# Patient Record
Sex: Male | Born: 1970 | Race: White | Hispanic: No | Smoking: Never smoker
Health system: Southern US, Community
[De-identification: ages and names within clinical notes are randomized; demographics above are authoritative.]

## PROBLEM LIST (undated history)

## (undated) DIAGNOSIS — E785 Hyperlipidemia, unspecified: Secondary | ICD-10-CM

## (undated) HISTORY — DX: Hyperlipidemia, unspecified: E78.5

---

## 2006-07-27 ENCOUNTER — Encounter: Admission: RE | Admit: 2006-07-27 | Discharge: 2006-07-27 | Payer: Self-pay | Admitting: Internal Medicine

## 2008-10-06 IMAGING — CR DG HAND COMPLETE 3+V*R*
3 series · 3 of 3 positions shown · non-contrast
Comparison: none

CLINICAL DATA: Crush injury. Pain in thumb and carpal region.
 RIGHT HAND ? 3 VIEW:

[view not recorded (1 of 3)]
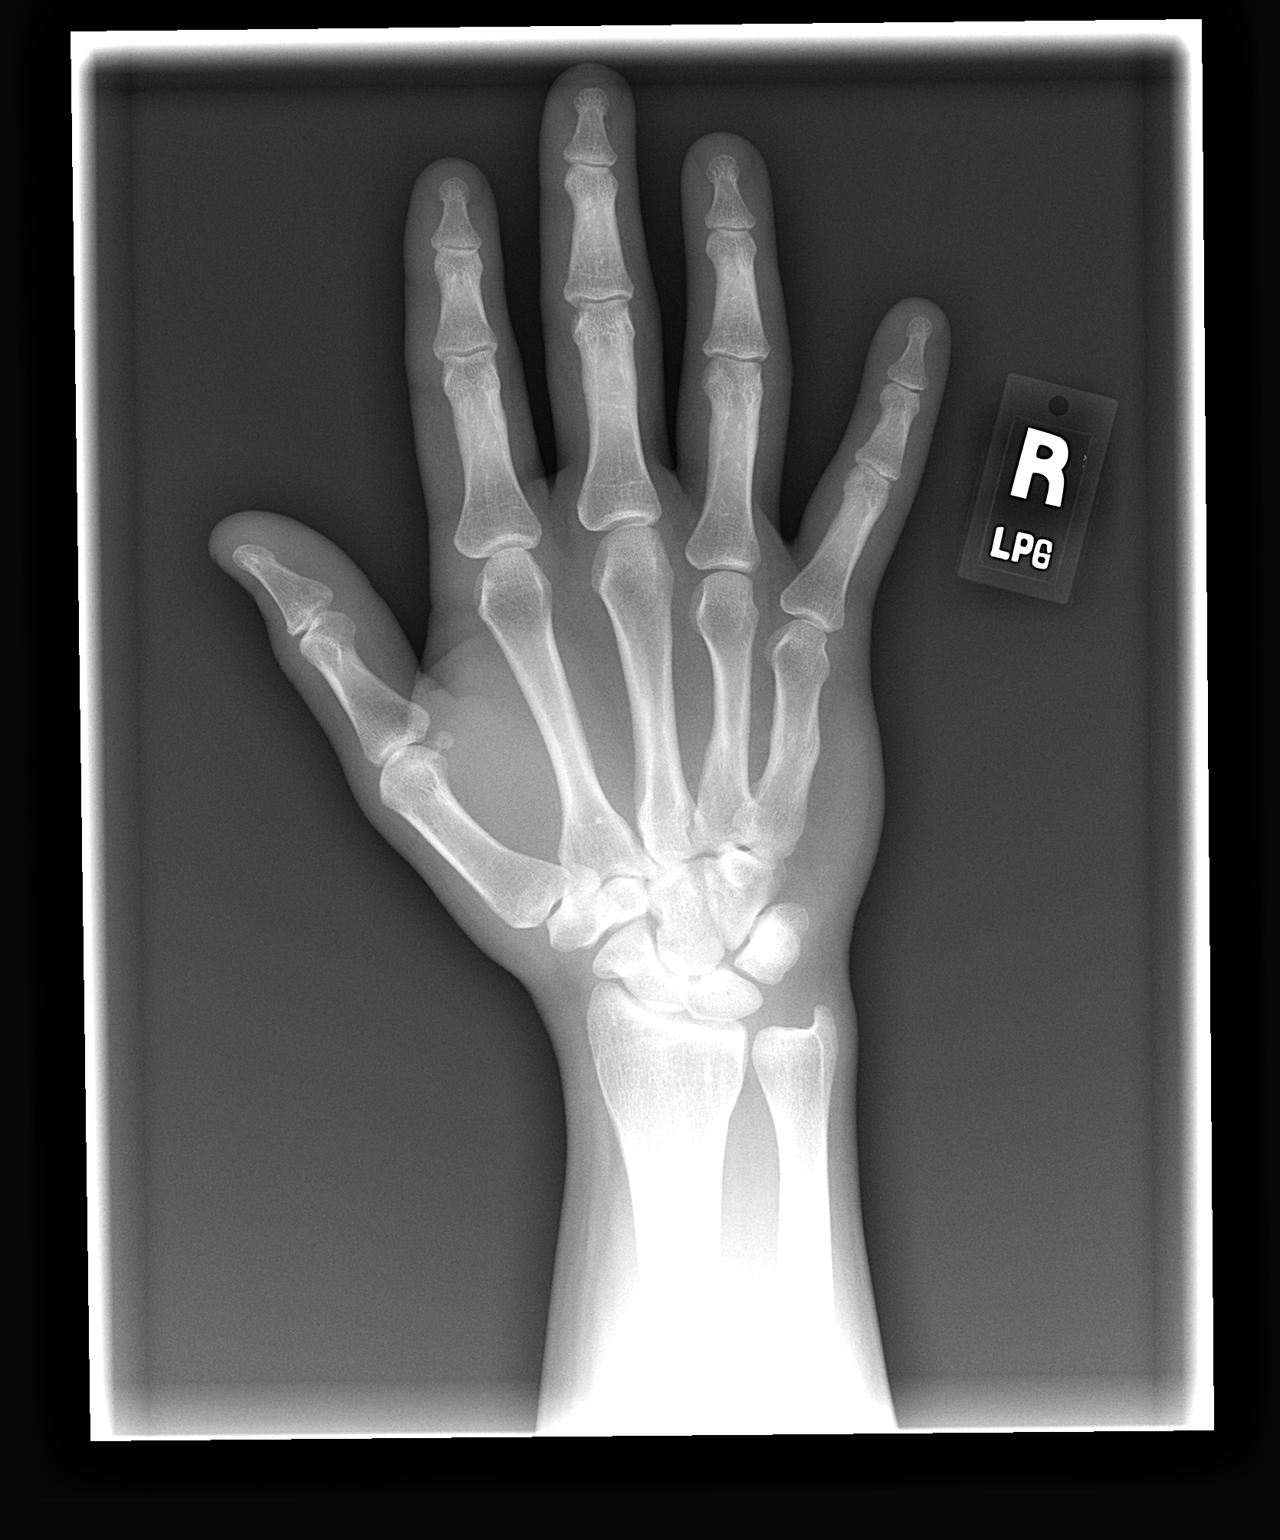

[view not recorded (2 of 3)]
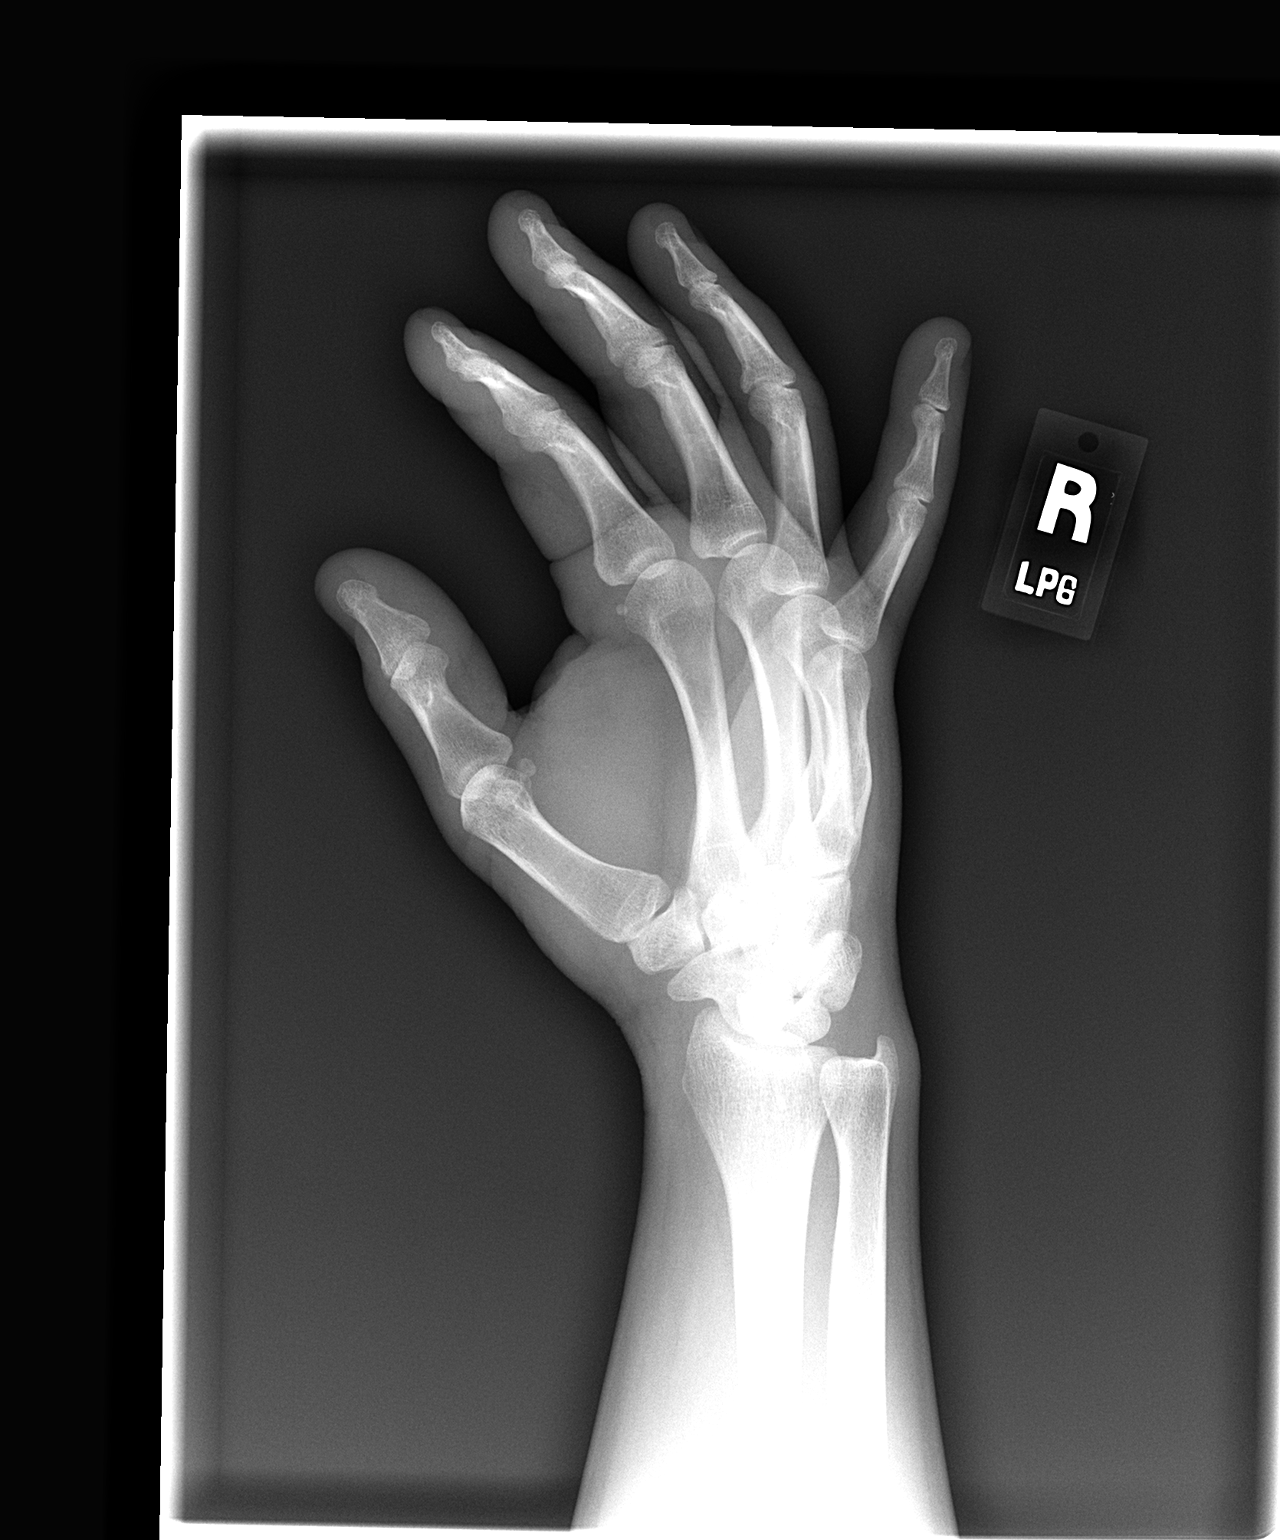

[view not recorded (3 of 3)]
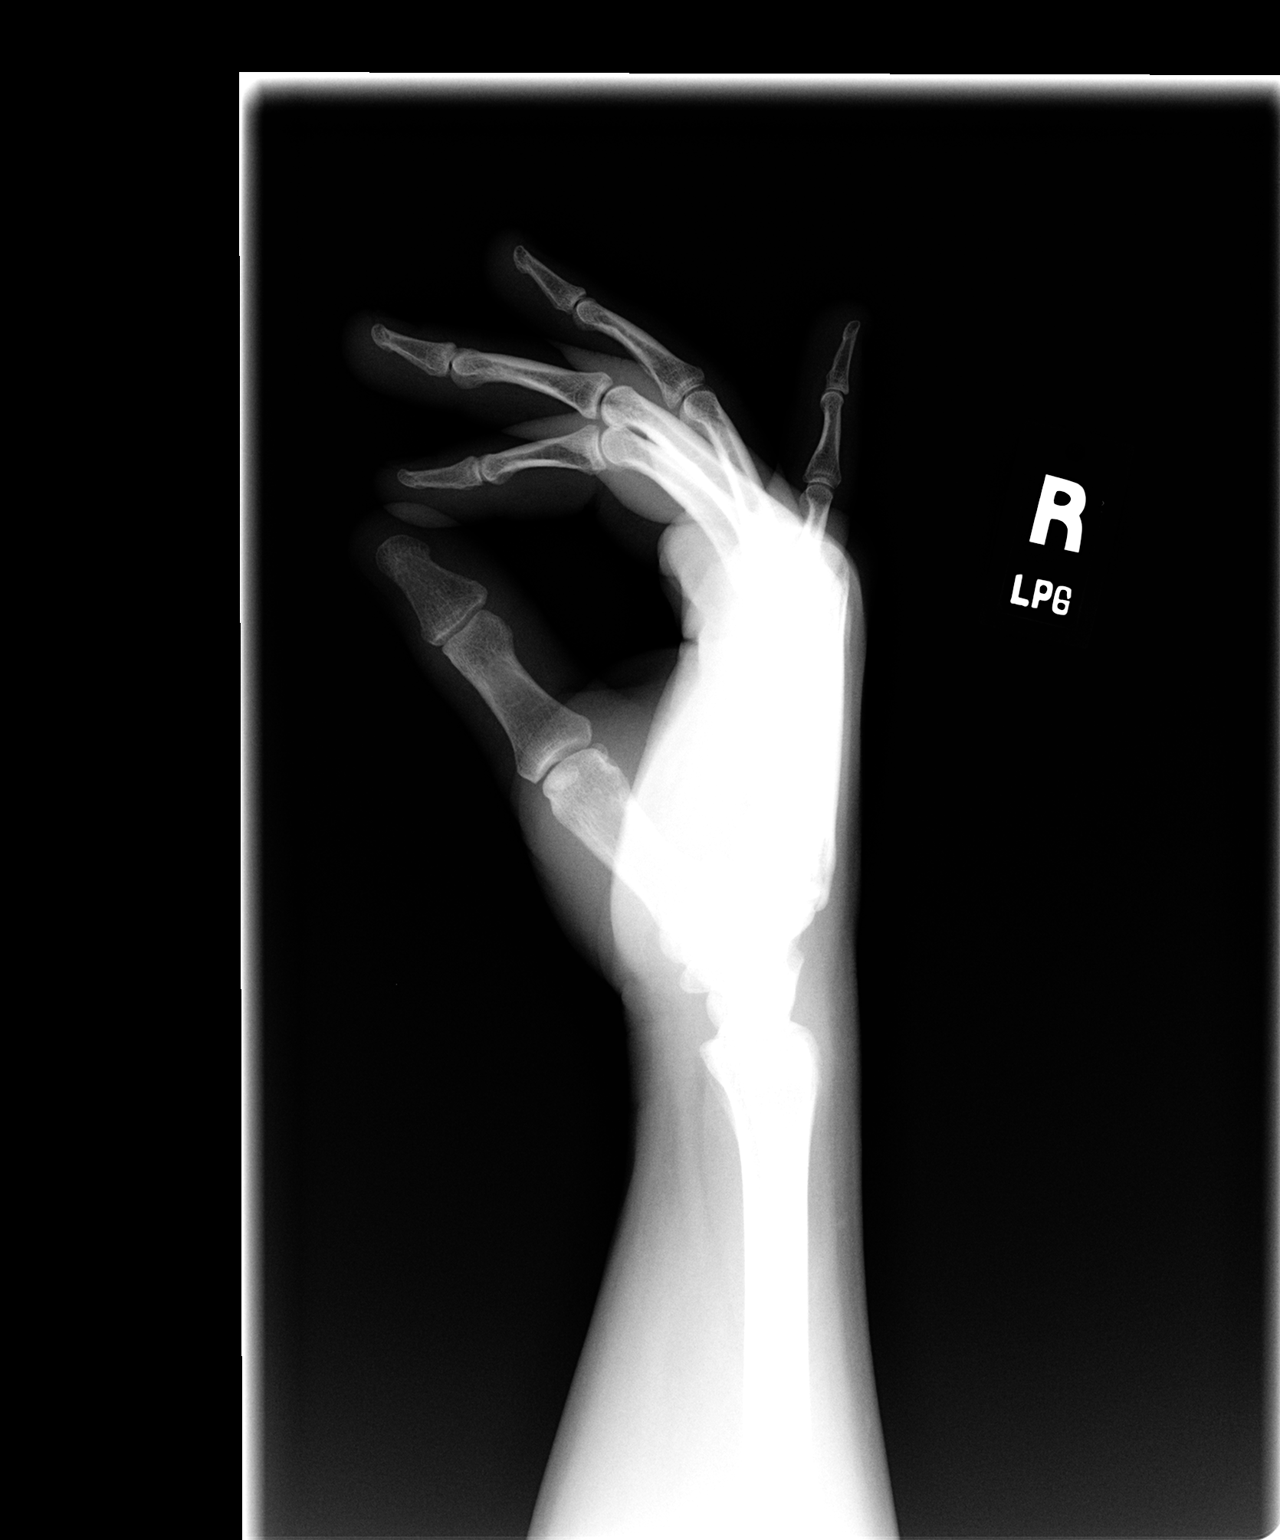

[3 of 3 positions shown; findings below may reference images not displayed]

FINDINGS: No acute fracture.  Old fracture deformity of the right fifth metacarpal shaft.  If there is any tenderness in the anatomical snuffbox, MR can be obtained to exclude an occult scaphoid injury.
IMPRESSION: 1. No acute fracture. 
 2. Old fracture of the right fifth metacarpal shaft.

## 2015-02-28 ENCOUNTER — Telehealth: Payer: Self-pay

## 2015-02-28 NOTE — Telephone Encounter (Signed)
Is he our patient

## 2015-02-28 NOTE — Telephone Encounter (Deleted)
Pt is needing a prescription for Testosterone. He is requesting a 90 day supply. This has to be signed and faxed.

## 2019-10-25 ENCOUNTER — Ambulatory Visit (INDEPENDENT_AMBULATORY_CARE_PROVIDER_SITE_OTHER): Payer: 59 | Admitting: Internal Medicine

## 2019-10-25 ENCOUNTER — Encounter (INDEPENDENT_AMBULATORY_CARE_PROVIDER_SITE_OTHER): Payer: Self-pay

## 2019-10-25 ENCOUNTER — Other Ambulatory Visit: Payer: Self-pay

## 2019-10-25 ENCOUNTER — Encounter (INDEPENDENT_AMBULATORY_CARE_PROVIDER_SITE_OTHER): Payer: Self-pay | Admitting: Internal Medicine

## 2019-10-25 VITALS — BP 140/80 | HR 72 | Ht 67.0 in | Wt 185.8 lb

## 2019-10-25 DIAGNOSIS — Z13220 Encounter for screening for lipoid disorders: Secondary | ICD-10-CM

## 2019-10-25 DIAGNOSIS — Z131 Encounter for screening for diabetes mellitus: Secondary | ICD-10-CM

## 2019-10-25 DIAGNOSIS — R41840 Attention and concentration deficit: Secondary | ICD-10-CM | POA: Diagnosis not present

## 2019-10-25 DIAGNOSIS — E663 Overweight: Secondary | ICD-10-CM

## 2019-10-25 DIAGNOSIS — Z125 Encounter for screening for malignant neoplasm of prostate: Secondary | ICD-10-CM

## 2019-10-25 DIAGNOSIS — R5381 Other malaise: Secondary | ICD-10-CM

## 2019-10-25 DIAGNOSIS — R5383 Other fatigue: Secondary | ICD-10-CM

## 2019-10-25 DIAGNOSIS — E559 Vitamin D deficiency, unspecified: Secondary | ICD-10-CM | POA: Diagnosis not present

## 2019-10-25 DIAGNOSIS — R6882 Decreased libido: Secondary | ICD-10-CM

## 2019-10-25 NOTE — Progress Notes (Signed)
Metrics: Intervention Frequency ACO  Documented Smoking Status Yearly  Screened one or more times in 24 months  Cessation Counseling or  Active cessation medication Past 24 months  Past 24 months   Guideline developer: UpToDate (See UpToDate for funding source) Date Released: 2014       Wellness Office Visit  Subjective:  Patient ID: Terrence Moran, male    DOB: 1970/08/31  Age: 49 y.o. MRN: [id number]  CC: This delightful 49 year old man comes to our practice as a new patient to establish care. HPI  His main symptoms include constant fatigue throughout the day, lack of focus and concentration and waking up with a mental fog.  He also describes decreased libido significantly.  He wonders whether he has low testosterone levels.  He denies any erectile dysfunction. He does exercise 4-5 times a week. Past Medical History:  Diagnosis Date  . Hyperlipidemia    History reviewed. No pertinent surgical history.   Family History  Problem Relation Age of Onset  . Diabetes Mother   . Heart disease Father   . Diabetes Father   . Obesity Brother     Social History   Social History Narrative   Married since 1997.Lives with wife and daughter.Unemployed,previously a International aid/development worker.   Social History   Tobacco Use  . Smoking status: Never Smoker  . Smokeless tobacco: Never Used  Substance Use Topics  . Alcohol use: Never    No outpatient medications have been marked as taking for the 10/25/19 encounter (Office Visit) with Face Singer, MD.      No flowsheet data found.   Objective:   Today's Vitals: BP 140/80   Pulse 72   Ht 5\' 7"  (1.702 m)   Wt 185 lb 12.8 oz (84.3 kg)   BMI 29.10 kg/m  Vitals with BMI 10/25/2019  Height 5\' 7"   Weight 185 lbs 13 oz  BMI 29.09  Systolic 140  Diastolic 80  Pulse 72     Physical Exam   He looks systemically well, appears fairly muscular.  Alert and orientated without any focal logical signs.    Assessment   1.  Overweight   2. Malaise and fatigue   3. Vitamin D deficiency disease   4. Lack of concentration   5. Decreased libido   6. Screening for diabetes mellitus   7. Screening for lipoid disorders   8. Special screening for malignant neoplasm of prostate       Tests ordered Orders Placed This Encounter  Procedures  . CBC  . COMPLETE METABOLIC PANEL WITH GFR  . Hemoglobin A1c  . Lipid panel  . PSA, Total with Reflex to PSA, Free  . T3, free  . T4  . TSH  . Testosterone Total,Free,Bio, Males  . VITAMIN D 25 Hydroxy (Vit-D Deficiency, Fractures)     Plan: 1. Blood work is ordered. 2. I will see him in the next several weeks to discuss all his results and further recommendations. 3. Today I spent 30 minutes with this patient going over his history and my recommendations at this point in time.   No orders of the defined types were placed in this encounter.   10/27/2019, MD

## 2019-10-26 ENCOUNTER — Encounter (INDEPENDENT_AMBULATORY_CARE_PROVIDER_SITE_OTHER): Payer: Self-pay | Admitting: Internal Medicine

## 2019-10-26 ENCOUNTER — Telehealth (INDEPENDENT_AMBULATORY_CARE_PROVIDER_SITE_OTHER): Payer: Self-pay | Admitting: Internal Medicine

## 2019-10-26 LAB — COMPLETE METABOLIC PANEL WITH GFR
AG Ratio: 1.6 (calc) (ref 1.0–2.5)
ALT: 24 U/L (ref 9–46)
AST: 19 U/L (ref 10–40)
Albumin: 4.3 g/dL (ref 3.6–5.1)
Alkaline phosphatase (APISO): 89 U/L (ref 36–130)
BUN: 23 mg/dL (ref 7–25)
CO2: 29 mmol/L (ref 20–32)
Calcium: 9.2 mg/dL (ref 8.6–10.3)
Chloride: 103 mmol/L (ref 98–110)
Creat: 1.27 mg/dL (ref 0.60–1.35)
GFR, Est African American: 77 mL/min/{1.73_m2} (ref >=60)
GFR, Est Non African American: 67 mL/min/{1.73_m2} (ref >=60)
Globulin: 2.7 g/dL (calc) (ref 1.9–3.7)
Glucose, Bld: 71 mg/dL (ref 65–99)
Potassium: 4.3 mmol/L (ref 3.5–5.3)
Sodium: 140 mmol/L (ref 135–146)
Total Bilirubin: 0.4 mg/dL (ref 0.2–1.2)
Total Protein: 7 g/dL (ref 6.1–8.1)

## 2019-10-26 LAB — CBC
HCT: 43.1 % (ref 38.5–50.0)
Hemoglobin: 14.4 g/dL (ref 13.2–17.1)
MCH: 29.7 pg (ref 27.0–33.0)
MCHC: 33.4 g/dL (ref 32.0–36.0)
MCV: 88.9 fL (ref 80.0–100.0)
MPV: 11.5 fL (ref 7.5–12.5)
Platelets: 232 10*3/uL (ref 140–400)
RBC: 4.85 10*6/uL (ref 4.20–5.80)
RDW: 12.9 % (ref 11.0–15.0)
WBC: 7.4 10*3/uL (ref 3.8–10.8)

## 2019-10-26 LAB — TSH: TSH: 2.52 mIU/L (ref 0.40–4.50)

## 2019-10-26 LAB — T3, FREE: T3, Free: 3.1 pg/mL (ref 2.3–4.2)

## 2019-10-26 LAB — HEMOGLOBIN A1C
Hgb A1c MFr Bld: 5.3 % of total Hgb (ref <5.7)
Mean Plasma Glucose: 105 (calc)
eAG (mmol/L): 5.8 (calc)

## 2019-10-26 LAB — TESTOSTERONE TOTAL,FREE,BIO, MALES
Albumin: 4.3 g/dL (ref 3.6–5.1)
Sex Hormone Binding: 59 nmol/L — ABNORMAL HIGH (ref 10–50)
Testosterone: 184 ng/dL — ABNORMAL LOW (ref 250–827)

## 2019-10-26 LAB — LIPID PANEL
Cholesterol: 223 mg/dL — ABNORMAL HIGH (ref <200)
HDL: 50 mg/dL (ref >=40)
LDL Cholesterol (Calc): 151 mg/dL (calc) — ABNORMAL HIGH
Non-HDL Cholesterol (Calc): 173 mg/dL (calc) — ABNORMAL HIGH (ref <130)
Total CHOL/HDL Ratio: 4.5 (calc) (ref <5.0)
Triglycerides: 102 mg/dL (ref <150)

## 2019-10-26 LAB — T4: T4, Total: 8.7 ug/dL (ref 4.9–10.5)

## 2019-10-26 LAB — PSA, TOTAL WITH REFLEX TO PSA, FREE: PSA, Total: <0.1 ng/mL (ref <=4.0)

## 2019-10-26 LAB — VITAMIN D 25 HYDROXY (VIT D DEFICIENCY, FRACTURES): Vit D, 25-Hydroxy: 39 ng/mL (ref 30–100)

## 2019-10-26 NOTE — Telephone Encounter (Signed)
Let him know that he does not need to have blood retaken as I have all the results now.  I will discuss with him all these results when I see him the next time.

## 2019-11-30 ENCOUNTER — Ambulatory Visit (INDEPENDENT_AMBULATORY_CARE_PROVIDER_SITE_OTHER): Payer: 59 | Admitting: Internal Medicine

## 2019-11-30 ENCOUNTER — Other Ambulatory Visit: Payer: Self-pay

## 2019-11-30 ENCOUNTER — Encounter (INDEPENDENT_AMBULATORY_CARE_PROVIDER_SITE_OTHER): Payer: Self-pay | Admitting: Internal Medicine

## 2019-11-30 VITALS — BP 122/78 | HR 64 | Temp 97.5°F | Ht 67.0 in | Wt 186.0 lb

## 2019-11-30 DIAGNOSIS — E559 Vitamin D deficiency, unspecified: Secondary | ICD-10-CM | POA: Diagnosis not present

## 2019-11-30 DIAGNOSIS — R6882 Decreased libido: Secondary | ICD-10-CM

## 2019-11-30 DIAGNOSIS — R41840 Attention and concentration deficit: Secondary | ICD-10-CM | POA: Diagnosis not present

## 2019-11-30 MED ORDER — TESTOSTERONE CYPIONATE 200 MG/ML IM SOLN: 100.0000 mg | INTRAMUSCULAR | 0 refills | Status: DC

## 2019-11-30 NOTE — Patient Instructions (Signed)
VITAMIN D3 5000 UNITS/DAY 

## 2019-11-30 NOTE — Progress Notes (Signed)
Metrics: Intervention Frequency ACO  Documented Smoking Status Yearly  Screened one or more times in 24 months  Cessation Counseling or  Active cessation medication Past 24 months  Past 24 months   Guideline developer: UpToDate (See UpToDate for funding source) Date Released: 2014       Wellness Office Visit  Subjective:  Patient ID: Terrence Moran, male    DOB: September 12, 1970  Age: 49 y.o. MRN: [id number]  CC: This man comes in to review all his blood work and further recommendations. HPI  His vitamin D levels are suboptimal. He has mild hyperlipidemia which does not require statin therapy. His testosterone levels are very low.  He has symptoms consistent with this also. Past Medical History:  Diagnosis Date  . Hyperlipidemia    History reviewed. No pertinent surgical history.   Family History  Problem Relation Age of Onset  . Diabetes Mother   . Heart disease Father   . Diabetes Father   . Obesity Brother     Social History   Social History Narrative   Married since 1997.Lives with wife and daughter.Unemployed,previously a International aid/development worker.   Social History   Tobacco Use  . Smoking status: Never Smoker  . Smokeless tobacco: Never Used  Substance Use Topics  . Alcohol use: Never    No outpatient medications have been marked as taking for the 11/30/19 encounter (Office Visit) with Santellan Singer, MD.      No flowsheet data found.   Objective:   Today's Vitals: BP 122/78   Pulse 64   Temp (!) 97.5 F (36.4 C) (Temporal)   Ht 5\' 7"  (1.702 m)   Wt 186 lb (84.4 kg)   SpO2 98%   BMI 29.13 kg/m  Vitals with BMI 11/30/2019 10/25/2019  Height 5\' 7"  5\' 7"   Weight 186 lbs 185 lbs 13 oz  BMI 29.12 29.09  Systolic 122 140  Diastolic 78 80  Pulse 64 72     Physical Exam  He looks systemically well.  He is overweight.  Blood pressure is in a good range.     Assessment   1. Decreased libido   2. Lack of concentration   3. Vitamin D  deficiency disease       Tests ordered No orders of the defined types were placed in this encounter.    Plan: 1. I recommended he start taking vitamin D3 5000 units daily over-the-counter. 2. We discussed testosterone therapy at length.  I discussed FDA warnings, benefits and side effects and mode of administration.  He would like to proceed and with injections.  I have sent the prescription to [facility] for testosterone cypionate injection 100 mg intramuscular once a week.  He will come back to the office on education regarding administration. 3. Follow-up in about 6 weeks time to see how he is doing and we will likely check levels then.   No orders of the defined types were placed in this encounter.   , MD

## 2019-12-01 ENCOUNTER — Ambulatory Visit (INDEPENDENT_AMBULATORY_CARE_PROVIDER_SITE_OTHER): Payer: 59

## 2019-12-01 VITALS — Resp 18 | Ht 67.0 in | Wt 186.0 lb

## 2019-12-01 DIAGNOSIS — R41840 Attention and concentration deficit: Secondary | ICD-10-CM

## 2019-12-01 DIAGNOSIS — R6882 Decreased libido: Secondary | ICD-10-CM

## 2019-12-01 MED ORDER — TESTOSTERONE CYPIONATE 200 MG/ML IM SOLN: 100.0000 mg | INTRAMUSCULAR | Status: AC

## 2019-12-01 NOTE — Progress Notes (Signed)
Pt was given information. Pt was given 0.60mL for the 1st training in the right leg the patient tolerated well. No complaint. Safety on disposal of needles & syringes also.

## 2020-01-05 ENCOUNTER — Encounter (INDEPENDENT_AMBULATORY_CARE_PROVIDER_SITE_OTHER): Payer: Self-pay | Admitting: Internal Medicine

## 2020-01-10 ENCOUNTER — Encounter (INDEPENDENT_AMBULATORY_CARE_PROVIDER_SITE_OTHER): Payer: Self-pay | Admitting: Internal Medicine

## 2020-01-10 ENCOUNTER — Other Ambulatory Visit: Payer: Self-pay

## 2020-01-10 ENCOUNTER — Ambulatory Visit (INDEPENDENT_AMBULATORY_CARE_PROVIDER_SITE_OTHER): Payer: 59 | Admitting: Internal Medicine

## 2020-01-10 VITALS — BP 130/86 | HR 64 | Temp 97.7°F | Ht 67.0 in | Wt 187.0 lb

## 2020-01-10 DIAGNOSIS — E7800 Pure hypercholesterolemia, unspecified: Secondary | ICD-10-CM | POA: Diagnosis not present

## 2020-01-10 DIAGNOSIS — R6882 Decreased libido: Secondary | ICD-10-CM | POA: Diagnosis not present

## 2020-01-10 DIAGNOSIS — R41840 Attention and concentration deficit: Secondary | ICD-10-CM | POA: Diagnosis not present

## 2020-01-10 DIAGNOSIS — E559 Vitamin D deficiency, unspecified: Secondary | ICD-10-CM | POA: Diagnosis not present

## 2020-01-10 NOTE — Patient Instructions (Signed)
[facility] Dietary Recommendations for Weight Loss What to Avoid . Avoid added sugars o Often added sugar can be found in processed foods such as many condiments, dry cereals, cakes, cookies, chips, crisps, crackers, candies, sweetened drinks, etc.  o Read labels and AVOID/DECREASE use of foods with the following in their ingredient list: Sugar, fructose, high fructose corn syrup, sucrose, glucose, maltose, dextrose, molasses, cane sugar, brown sugar, any type of syrup, agave nectar, etc.   . Avoid snacking in between meals . Avoid foods made with flour o If you are going to eat food made with flour, choose those made with whole-grains; and, minimize your consumption as much as is tolerable . Avoid processed foods o These foods are generally stocked in the middle of the grocery store. Focus on shopping on the perimeter of the grocery.  . Avoid Meat  o We recommend following a plant-based diet at [facility]. Thus, we recommend avoiding meat as a general rule. Consider eating beans, legumes, eggs, and/or dairy products for regular protein sources o If you plan on eating meat limit to 4 ounces of meat at a time and choose lean options such as Fish, chicken, turkey. Avoid red meat intake such as pork and/or steak What to Include . Vegetables o GREEN LEAFY VEGETABLES: Kale, spinach, mustard greens, collard greens, cabbage, broccoli, etc. o OTHER: Asparagus, cauliflower, eggplant, carrots, peas, Brussel sprouts, tomatoes, bell peppers, zucchini, beets, cucumbers, etc. . Grains, seeds, and legumes o Beans: kidney beans, black eyed peas, garbanzo beans, black beans, pinto beans, etc. o Whole, unrefined grains: brown rice, barley, bulgur, oatmeal, etc. . Healthy fats  o Avoid highly processed fats such as vegetable oil o Examples of healthy fats: avocado, olives, virgin olive oil, dark chocolate (?72% Cocoa), nuts (peanuts, almonds, walnuts, cashews, pecans, etc.) . None to Low  Intake of Animal Sources of Protein o Meat sources: chicken, turkey, salmon, tuna. Limit to 4 ounces of meat at one time. o Consider limiting dairy sources, but when choosing dairy focus on: PLAIN Greek yogurt, cottage cheese, high-protein milk . Fruit o Choose berries  When to Eat . Intermittent Fasting: o Choosing not to eat for a specific time period, but DO FOCUS ON HYDRATION when fasting o Multiple Techniques: - Time Restricted Eating: eat 3 meals in a day, each meal lasting no more than 60 minutes, no snacks between meals - 16-18 hour fast: fast for 16 to 18 hours up to 7 days a week. Often suggested to start with 2-3 nonconsecutive days per week.  . Remember the time you sleep is counted as fasting.  . Examples of eating schedule: Fast from 7:00pm-11:00am. Eat between 11:00am-7:00pm.  - 24-hour fast: fast for 24 hours up to every other day. Often suggested to start with 1 day per week . Remember the time you sleep is counted as fasting . Examples of eating schedule:  o Eating day: eat 2-3 meals on your eating day. If doing 2 meals, each meal should last no more than 90 minutes. If doing 3 meals, each meal should last no more than 60 minutes. Finish last meal by 7:00pm. o Fasting day: Fast until 7:00pm.  o IF YOU FEEL UNWELL FOR ANY REASON/IN ANY WAY WHEN FASTING, STOP FASTING BY EATING A NUTRITIOUS SNACK OR LIGHT MEAL o ALWAYS FOCUS ON HYDRATION DURING FASTS - Acceptable Hydration sources: water, broths, tea/coffee (black tea/coffee is best but using a small amount of whole-fat dairy products in coffee/tea is acceptable).  -   Poor Hydration Sources: anything with sugar or artificial sweeteners added to it  These recommendations have been developed for patients that are actively receiving medical care from either Dr. Ophia Shamoon or Sarah Gray, DNP, NP-C at [facility]. These recommendations are developed for patients with specific medical conditions and are not meant to be  distributed or used by others that are not actively receiving care from either provider listed above at [facility]. It is not appropriate to participate in the above eating plans without proper medical supervision.   Reference: Fung, J. The obesity code. [place]: [place]; 2016.   

## 2020-01-10 NOTE — Progress Notes (Signed)
Metrics: Intervention Frequency ACO  Documented Smoking Status Yearly  Screened one or more times in 24 months  Cessation Counseling or  Active cessation medication Past 24 months  Past 24 months   Guideline developer: UpToDate (See UpToDate for funding source) Date Released: 2014       Wellness Office Visit  Subjective:  Patient ID: Terrence Moran, male    DOB: 07-01-1970  Age: 49 y.o. MRN: [id number]  CC: This man comes in for follow-up of hypogonadism, vitamin D deficiency and hyper lipidemia. HPI  He feels much improved since testosterone therapy started in particular focus and concentration has improved. Erectile dysfunction has also improved to some degree. He does notice that towards the second half of the week, his energy levels are reduced. He has been taking vitamin D3 5000 units daily and is tolerating it. Past Medical History:  Diagnosis Date  . Hyperlipidemia    History reviewed. No pertinent surgical history.   Family History  Problem Relation Age of Onset  . Diabetes Mother   . Heart disease Father   . Diabetes Father   . Obesity Brother     Social History   Social History Narrative   Married since 1997.Lives with wife and daughter.Unemployed,previously a International aid/development worker.   Social History   Tobacco Use  . Smoking status: Never Smoker  . Smokeless tobacco: Never Used  Substance Use Topics  . Alcohol use: Never    Current Meds  Medication Sig  . Cholecalciferol (VITAMIN D-3) 125 MCG (5000 UT) TABS Take 2 tablets by mouth daily.   Marland Kitchen POLY HUB NEEDLE 23G X 1" MISC   . testosterone cypionate (DEPO-TESTOSTERONE) 200 MG/ML injection Inject 0.5 mLs (100 mg total) into the muscle every 7 (seven) days.  . TUBERCULIN SYR 1CC/25GX5/8" 25G X 5/8" 1 ML MISC    Current Facility-Administered Medications for the 01/10/20 encounter (Office Visit) with Auer Singer, MD  Medication  . testosterone cypionate (DEPOTESTOSTERONE CYPIONATE) injection 100 mg       No flowsheet data found.   Objective:   Today's Vitals: BP 130/86   Pulse 64   Temp 97.7 F (36.5 C) (Temporal)   Ht 5\' 7"  (1.702 m)   Wt 187 lb (84.8 kg)   SpO2 99%   BMI 29.29 kg/m  Vitals with BMI 01/10/2020 12/01/2019 11/30/2019  Height 5\' 7"  5\' 7"  5\' 7"   Weight 187 lbs 186 lbs 186 lbs  BMI 29.28 29.12 29.12  Systolic 130 - 122  Diastolic 86 - 78  Pulse 64 - 64     Physical Exam  He looks systemically well. No new physical findings today.     Assessment   1. Decreased libido   2. Lack of concentration   3. Vitamin D deficiency disease   4. Pure hypercholesterolemia       Tests ordered No orders of the defined types were placed in this encounter.    Plan: 1. In view of his symptoms which are not completely resolved, I have told him to increase testosterone to 0.5 mL twice a week and explained in detail how he should do this. 2. I have also recommended increased vitamin D3 to 10,000 units daily. 3. We also discussed nutrition and the concept of intermittent fasting combined with a more plant-based diet. 4. Follow-up in a couple of months.   No orders of the defined types were placed in this encounter.   12/02/2019, MD

## 2020-01-31 ENCOUNTER — Telehealth (INDEPENDENT_AMBULATORY_CARE_PROVIDER_SITE_OTHER): Payer: Self-pay

## 2020-01-31 NOTE — Telephone Encounter (Signed)
Patients wife called and stated that when the patient gives his testosterone injections he is having trouble bleeding every time after the injection.  Please advise.

## 2020-01-31 NOTE — Telephone Encounter (Signed)
Okay,since this is an issue for him,ask the patient to stop this for now and I can address his testosterone therapy on the next visit in February.If he does not want to wait that ,please schedule an earlier appt.

## 2020-01-31 NOTE — Telephone Encounter (Signed)
Called patient and spoke to his wife and they decided to keep the current appointment in February since we did not have an opening for him and will call back if he needs to see if we have a cancellation or opening before his next visit if he feels he needs to. Wife verbalized an understanding.

## 2020-02-20 ENCOUNTER — Other Ambulatory Visit (INDEPENDENT_AMBULATORY_CARE_PROVIDER_SITE_OTHER): Payer: Self-pay | Admitting: Internal Medicine

## 2020-02-20 ENCOUNTER — Encounter (INDEPENDENT_AMBULATORY_CARE_PROVIDER_SITE_OTHER): Payer: Self-pay | Admitting: Internal Medicine

## 2020-02-21 MED ORDER — TESTOSTERONE CYPIONATE 200 MG/ML IM SOLN: 100.0000 mg | INTRAMUSCULAR | 1 refills | Status: DC

## 2020-03-14 ENCOUNTER — Other Ambulatory Visit: Payer: Self-pay

## 2020-03-14 ENCOUNTER — Encounter (INDEPENDENT_AMBULATORY_CARE_PROVIDER_SITE_OTHER): Payer: Self-pay | Admitting: Internal Medicine

## 2020-03-14 ENCOUNTER — Ambulatory Visit (INDEPENDENT_AMBULATORY_CARE_PROVIDER_SITE_OTHER): Payer: 59 | Admitting: Internal Medicine

## 2020-03-14 VITALS — BP 140/84 | HR 64 | Temp 97.5°F | Resp 15 | Ht 67.0 in | Wt 183.0 lb

## 2020-03-14 DIAGNOSIS — E7800 Pure hypercholesterolemia, unspecified: Secondary | ICD-10-CM | POA: Diagnosis not present

## 2020-03-14 DIAGNOSIS — R6882 Decreased libido: Secondary | ICD-10-CM

## 2020-03-14 DIAGNOSIS — E559 Vitamin D deficiency, unspecified: Secondary | ICD-10-CM

## 2020-03-14 NOTE — Progress Notes (Signed)
Metrics: Intervention Frequency ACO  Documented Smoking Status Yearly  Screened one or more times in 24 months  Cessation Counseling or  Active cessation medication Past 24 months  Past 24 months   Guideline developer: UpToDate (See UpToDate for funding source) Date Released: 2014       Wellness Office Visit  Subjective:  Patient ID: Terrence Moran, male    DOB: [birth year]  Age: 50 y.o. MRN: [id number]  CC: This man comes in for follow-up regarding his testosterone therapy dyslipidemia and vitamin D deficiency. HPI  The last time I saw him, we increase testosterone to twice a week and he has tolerated this well and feels absolutely fantastic.  He has more energy, sex drive is improved.  He is also started doing intermittent fasting on a regular basis and he has lost weight.  He continues to exercise on a regular basis. He has also been taking vitamin D3 10,000 units daily on a consistent basis. He does have a history of dyslipidemia in the past when he first saw me. Past Medical History:  Diagnosis Date  . Hyperlipidemia    History reviewed. No pertinent surgical history.   Family History  Problem Relation Age of Onset  . Diabetes Mother   . Heart disease Father   . Diabetes Father   . Obesity Brother     Social History   Social History Narrative   Married since 1997.Lives with wife and daughter.Unemployed,previously a International aid/development worker.   Social History   Tobacco Use  . Smoking status: Never Smoker  . Smokeless tobacco: Never Used  Substance Use Topics  . Alcohol use: Never    Current Meds  Medication Sig  . Cholecalciferol (VITAMIN D-3) 125 MCG (5000 UT) TABS Take 2 tablets by mouth daily.   Marland Kitchen POLY HUB NEEDLE 23G X 1" MISC   . testosterone cypionate (DEPO-TESTOSTERONE) 200 MG/ML injection Inject 0.5 mLs (100 mg total) into the muscle 2 (two) times a week.  . [DISCONTINUED] TUBERCULIN SYR 1CC/25GX5/8" 25G X 5/8" 1 ML MISC    Current Facility-Administered  Medications for the 03/14/20 encounter (Office Visit) with Wilinski Singer, MD  Medication  . testosterone cypionate (DEPOTESTOSTERONE CYPIONATE) injection 100 mg       Objective:   Today's Vitals: BP 140/84   Pulse 64   Temp (!) 97.5 F (36.4 C) (Temporal)   Resp 15   Ht 5\' 7"  (1.702 m)   Wt 183 lb (83 kg)   SpO2 96%   BMI 28.66 kg/m  Vitals with BMI 03/14/2020 01/10/2020 12/01/2019  Height 5\' 7"  5\' 7"  5\' 7"   Weight 183 lbs 187 lbs 186 lbs  BMI 28.66 29.28 29.12  Systolic 140 130 -  Diastolic 84 86 -  Pulse 64 64 -     Physical Exam   He looks systemically well.  He has lost 4 pounds since the last time I saw him.  Blood pressure slightly elevated today.  He looks more muscular than I have seen him previously.    Assessment   1. Decreased libido   2. Pure hypercholesterolemia   3. Vitamin D deficiency disease       Tests ordered Orders Placed This Encounter  Procedures  . COMPLETE METABOLIC PANEL WITH GFR  . Lipid panel  . Testosterone Total,Free,Bio, Males  . VITAMIN D 25 Hydroxy (Vit-D Deficiency, Fractures)     Plan: 1. He will continue with testosterone therapy as before. 2. We will check testosterone levels, lipid panel and vitamin  D levels. 3. He will continue with vitamin D3 10,000 units daily. 4. Further recommendations will depend on blood results and I will see him in about 3 months time for an annual physical exam   No orders of the defined types were placed in this encounter.   Paulson Singer, MD

## 2020-03-15 LAB — TESTOSTERONE TOTAL,FREE,BIO, MALES
Albumin: 3.9 g/dL (ref 3.6–5.1)
Sex Hormone Binding: 60 nmol/L — ABNORMAL HIGH (ref 10–50)
Testosterone, Bioavailable: 340.1 ng/dL (ref >=110.0)
Testosterone, Free: 189.4 pg/mL (ref 46.0–224.0)
Testosterone: 1712 ng/dL — ABNORMAL HIGH (ref 250–827)

## 2020-03-15 LAB — COMPLETE METABOLIC PANEL WITH GFR
AG Ratio: 1.6 (calc) (ref 1.0–2.5)
ALT: 25 U/L (ref 9–46)
AST: 21 U/L (ref 10–40)
Albumin: 3.9 g/dL (ref 3.6–5.1)
Alkaline phosphatase (APISO): 72 U/L (ref 36–130)
BUN/Creatinine Ratio: 9 (calc) (ref 6–22)
BUN: 13 mg/dL (ref 7–25)
CO2: 27 mmol/L (ref 20–32)
Calcium: 9.3 mg/dL (ref 8.6–10.3)
Chloride: 104 mmol/L (ref 98–110)
Creat: 1.45 mg/dL — ABNORMAL HIGH (ref 0.60–1.35)
GFR, Est African American: 66 mL/min/{1.73_m2} (ref >=60)
GFR, Est Non African American: 57 mL/min/{1.73_m2} — ABNORMAL LOW (ref >=60)
Globulin: 2.5 g/dL (calc) (ref 1.9–3.7)
Glucose, Bld: 83 mg/dL (ref 65–139)
Potassium: 4.1 mmol/L (ref 3.5–5.3)
Sodium: 139 mmol/L (ref 135–146)
Total Bilirubin: 0.6 mg/dL (ref 0.2–1.2)
Total Protein: 6.4 g/dL (ref 6.1–8.1)

## 2020-03-15 LAB — LIPID PANEL
Cholesterol: 223 mg/dL — ABNORMAL HIGH (ref <200)
HDL: 43 mg/dL (ref >=40)
LDL Cholesterol (Calc): 162 mg/dL (calc) — ABNORMAL HIGH
Non-HDL Cholesterol (Calc): 180 mg/dL (calc) — ABNORMAL HIGH (ref <130)
Total CHOL/HDL Ratio: 5.2 (calc) — ABNORMAL HIGH (ref <5.0)
Triglycerides: 75 mg/dL (ref <150)

## 2020-03-15 LAB — VITAMIN D 25 HYDROXY (VIT D DEFICIENCY, FRACTURES): Vit D, 25-Hydroxy: 91 ng/mL (ref 30–100)

## 2020-03-18 ENCOUNTER — Encounter (INDEPENDENT_AMBULATORY_CARE_PROVIDER_SITE_OTHER): Payer: Self-pay | Admitting: Internal Medicine

## 2020-04-16 ENCOUNTER — Encounter (INDEPENDENT_AMBULATORY_CARE_PROVIDER_SITE_OTHER): Payer: Self-pay | Admitting: Internal Medicine

## 2020-06-13 ENCOUNTER — Encounter (INDEPENDENT_AMBULATORY_CARE_PROVIDER_SITE_OTHER): Payer: Self-pay | Admitting: Internal Medicine

## 2020-06-13 ENCOUNTER — Ambulatory Visit (INDEPENDENT_AMBULATORY_CARE_PROVIDER_SITE_OTHER): Payer: 59 | Admitting: Internal Medicine

## 2020-06-13 ENCOUNTER — Other Ambulatory Visit: Payer: Self-pay

## 2020-06-13 VITALS — BP 121/80 | HR 88 | Temp 97.2°F | Resp 18 | Ht 65.0 in | Wt 180.0 lb

## 2020-06-13 DIAGNOSIS — E7800 Pure hypercholesterolemia, unspecified: Secondary | ICD-10-CM

## 2020-06-13 DIAGNOSIS — R6882 Decreased libido: Secondary | ICD-10-CM | POA: Diagnosis not present

## 2020-06-13 DIAGNOSIS — E559 Vitamin D deficiency, unspecified: Secondary | ICD-10-CM | POA: Diagnosis not present

## 2020-06-13 DIAGNOSIS — Z0001 Encounter for general adult medical examination with abnormal findings: Secondary | ICD-10-CM

## 2020-06-13 MED ORDER — AMOXICILLIN-POT CLAVULANATE 875-125 MG PO TABS: 1.0000 | ORAL_TABLET | Freq: Two times a day (BID) | ORAL | 0 refills | Status: AC

## 2020-06-13 NOTE — Progress Notes (Signed)
Chief Complaint: This very pleasant 50 year old man comes in for an annual physical exam and to address his chronic conditions which are described below. HPI: He has a history of hyperlipidemia which is not quite statin therapy.  He does not have a history of coronary artery disease or cerebrovascular disease. He has been doing intermittent fasting on a daily basis, usually 16 hours and is trying to eat healthier.  He exercises on a regular basis. He continues on testosterone therapy also twice a week and he feels great. He does have some lesions on his trunk, which may be related to staph aureus which can occur with testosterone therapy sometimes.  He describes it as acne.  Past Medical History:  Diagnosis Date  . Hyperlipidemia    History reviewed. No pertinent surgical history.   Social History   Social History Narrative   Married since 1997.Lives with wife and daughter.Works for [employer].    Social History   Tobacco Use  . Smoking status: Never Smoker  . Smokeless tobacco: Never Used  Substance Use Topics  . Alcohol use: Never      Allergies: No Known Allergies   Current Meds  Medication Sig  . amoxicillin-clavulanate (AUGMENTIN) 875-125 MG tablet Take 1 tablet by mouth 2 (two) times daily.  . Cholecalciferol (VITAMIN D-3) 125 MCG (5000 UT) TABS Take 2 tablets by mouth daily.   Marland Kitchen POLY HUB NEEDLE 23G X 1" MISC   . testosterone cypionate (DEPO-TESTOSTERONE) 200 MG/ML injection Inject 0.5 mLs (100 mg total) into the muscle 2 (two) times a week.   Current Facility-Administered Medications for the 06/13/20 encounter (Office Visit) with Adamson Singer, MD  Medication  . testosterone cypionate (DEPOTESTOSTERONE CYPIONATE) injection 100 mg       [id number] from the symptoms mentioned above,there are no other symptoms referable to all systems reviewed.       Physical Exam: Blood pressure 121/80, pulse 88, temperature (!) 97.2 F (36.2 C),  temperature source Temporal, resp. rate 18, height 5\' 5"  (1.651 m), weight 180 lb (81.6 kg), SpO2 98 %. Vitals with BMI 06/13/2020 03/14/2020 01/10/2020  Height 5\' 5"  5\' 7"  5\' 7"   Weight 180 lbs 183 lbs 187 lbs  BMI 29.95 28.66 29.28  Systolic 121 140 14/08/2019  Diastolic 80 84 86  Pulse 88 64 64      He looks systemically well, muscular.  Good blood pressure. General: Alert, cooperative, and appears to be the stated age.No pallor.  No jaundice.  No clubbing. Head: Normocephalic Eyes: Sclera white, pupils equal and reactive to light, red reflex x 2,  Ears: Normal bilaterally Oral cavity: Lips, mucosa, and tongue normal: Teeth and gums normal Neck: No adenopathy, supple, symmetrical, trachea midline, and thyroid does not appear enlarged Respiratory: Clear to auscultation bilaterally.No wheezing, crackles or bronchial breathing. Cardiovascular: Heart sounds are present and appear to be normal without murmurs or added sounds.  No carotid bruits.  Peripheral pulses are present and equal bilaterally.: Gastrointestinal:positive bowel sounds, no hepatosplenomegaly.  No masses felt.No tenderness. Skin: Clear, No rashes noted.No worrisome skin lesions seen. Neurological: Grossly intact without focal findings, cranial nerves II through XII intact, muscle strength equal bilaterally Musculoskeletal: No acute joint abnormalities noted.Full range of movement noted with joints. Psychiatric: Affect appropriate, non-anxious.    Assessment  1. Encounter for general adult medical examination with abnormal findings   2. Decreased libido   3. Pure hypercholesterolemia   4. Vitamin D deficiency disease     Tests Ordered:  Orders Placed This Encounter  Procedures  . COMPLETE METABOLIC PANEL WITH GFR  . Lipid panel  . DHEA-sulfate     Plan  1. Healthy 50 year old man with hyperlipidemia and on testosterone therapy. 2. Continue with testosterone therapy as before and his levels previously were in a  good range. 3. We will check a lipid panel to see if there is any improvement.  Also check complete metabolic panel and DHEA. 4. Continue with vitamin D3 for vitamin D deficiency. 5. I also recommended the use of melatonin at night. 6. Further recommendations will depend on the results and I will see him in about 4 months time for follow-up. 7. Today, in addition to a preventative visit, I performed an office visit to address his testosterone, hyperlipidemia and vitamin D deficiency.     Meds ordered this encounter  Medications  . amoxicillin-clavulanate (AUGMENTIN) 875-125 MG tablet    Sig: Take 1 tablet by mouth 2 (two) times daily.    Dispense:  20 tablet    Refill:  0     Ronnesha Mester C Meriam Chojnowski   06/13/2020, 10:44 AM

## 2020-06-14 ENCOUNTER — Encounter (INDEPENDENT_AMBULATORY_CARE_PROVIDER_SITE_OTHER): Payer: Self-pay | Admitting: Internal Medicine

## 2020-06-14 LAB — COMPLETE METABOLIC PANEL WITH GFR
AG Ratio: 1.8 (calc) (ref 1.0–2.5)
ALT: 25 U/L (ref 9–46)
AST: 26 U/L (ref 10–40)
Albumin: 4.3 g/dL (ref 3.6–5.1)
Alkaline phosphatase (APISO): 78 U/L (ref 36–130)
BUN: 15 mg/dL (ref 7–25)
CO2: 26 mmol/L (ref 20–32)
Calcium: 9.3 mg/dL (ref 8.6–10.3)
Chloride: 103 mmol/L (ref 98–110)
Creat: 1.32 mg/dL (ref 0.60–1.35)
GFR, Est African American: 74 mL/min/{1.73_m2} (ref >=60)
GFR, Est Non African American: 64 mL/min/{1.73_m2} (ref >=60)
Globulin: 2.4 g/dL (calc) (ref 1.9–3.7)
Glucose, Bld: 79 mg/dL (ref 65–99)
Potassium: 4.2 mmol/L (ref 3.5–5.3)
Sodium: 138 mmol/L (ref 135–146)
Total Bilirubin: 0.8 mg/dL (ref 0.2–1.2)
Total Protein: 6.7 g/dL (ref 6.1–8.1)

## 2020-06-14 LAB — DHEA-SULFATE: DHEA-SO4: 155 ug/dL (ref 61–442)

## 2020-06-14 LAB — LIPID PANEL
Cholesterol: 223 mg/dL — ABNORMAL HIGH (ref <200)
HDL: 50 mg/dL (ref >=40)
LDL Cholesterol (Calc): 159 mg/dL (calc) — ABNORMAL HIGH
Non-HDL Cholesterol (Calc): 173 mg/dL (calc) — ABNORMAL HIGH (ref <130)
Total CHOL/HDL Ratio: 4.5 (calc) (ref <5.0)
Triglycerides: 51 mg/dL (ref <150)

## 2020-07-02 ENCOUNTER — Encounter (INDEPENDENT_AMBULATORY_CARE_PROVIDER_SITE_OTHER): Payer: Self-pay | Admitting: Internal Medicine

## 2020-07-03 ENCOUNTER — Other Ambulatory Visit (INDEPENDENT_AMBULATORY_CARE_PROVIDER_SITE_OTHER): Payer: Self-pay | Admitting: Internal Medicine

## 2020-07-03 MED ORDER — TESTOSTERONE CYPIONATE 200 MG/ML IM SOLN: 100.0000 mg | INTRAMUSCULAR | 2 refills | Status: AC

## 2020-09-04 ENCOUNTER — Other Ambulatory Visit (INDEPENDENT_AMBULATORY_CARE_PROVIDER_SITE_OTHER): Payer: Self-pay | Admitting: Internal Medicine

## 2020-09-05 ENCOUNTER — Encounter (INDEPENDENT_AMBULATORY_CARE_PROVIDER_SITE_OTHER): Payer: Self-pay | Admitting: Nurse Practitioner

## 2020-09-07 ENCOUNTER — Other Ambulatory Visit (INDEPENDENT_AMBULATORY_CARE_PROVIDER_SITE_OTHER): Payer: Self-pay

## 2020-10-18 ENCOUNTER — Ambulatory Visit (INDEPENDENT_AMBULATORY_CARE_PROVIDER_SITE_OTHER): Payer: 59 | Admitting: Internal Medicine
